# Patient Record
Sex: Female | Born: 1975 | Race: Asian | Hispanic: No | Marital: Married | Smoking: Never smoker
Health system: Southern US, Community
[De-identification: ages and names within clinical notes are randomized; demographics above are authoritative.]

---

## 2007-12-10 ENCOUNTER — Emergency Department (HOSPITAL_COMMUNITY): Admission: EM | Admit: 2007-12-10 | Discharge: 2007-12-10 | Payer: Self-pay | Admitting: Family Medicine

## 2010-05-16 ENCOUNTER — Ambulatory Visit: Payer: Self-pay | Admitting: Nurse Practitioner

## 2010-05-16 ENCOUNTER — Encounter (INDEPENDENT_AMBULATORY_CARE_PROVIDER_SITE_OTHER): Payer: Self-pay | Admitting: Nurse Practitioner

## 2010-05-16 DIAGNOSIS — R109 Unspecified abdominal pain: Secondary | ICD-10-CM | POA: Insufficient documentation

## 2010-05-16 DIAGNOSIS — M549 Dorsalgia, unspecified: Secondary | ICD-10-CM | POA: Insufficient documentation

## 2010-05-16 DIAGNOSIS — M25519 Pain in unspecified shoulder: Secondary | ICD-10-CM | POA: Insufficient documentation

## 2010-05-16 DIAGNOSIS — G479 Sleep disorder, unspecified: Secondary | ICD-10-CM | POA: Insufficient documentation

## 2010-05-16 LAB — CONVERTED CEMR LAB
ALT: 9 units/L (ref 0–35)
Basophils Absolute: 0 10*3/uL (ref 0.0–0.1)
Blood in Urine, dipstick: NEGATIVE
CO2: 24 meq/L (ref 19–32)
Calcium: 8.7 mg/dL (ref 8.4–10.5)
Chloride: 104 meq/L (ref 96–112)
Cholesterol: 191 mg/dL (ref 0–200)
Creatinine, Ser: 0.51 mg/dL (ref 0.40–1.20)
Glucose, Urine, Semiquant: NEGATIVE
Hemoglobin: 10.9 g/dL — ABNORMAL LOW (ref 12.0–15.0)
Ketones, urine, test strip: NEGATIVE
Lymphocytes Relative: 42 % (ref 12–46)
Monocytes Absolute: 0.2 10*3/uL (ref 0.1–1.0)
Monocytes Relative: 5 % (ref 3–12)
Neutro Abs: 1.9 10*3/uL (ref 1.7–7.7)
RBC: 4.59 M/uL (ref 3.87–5.11)
RDW: 14.8 % (ref 11.5–15.5)
Rapid HIV Screen: NEGATIVE
Total CHOL/HDL Ratio: 4.3
WBC: 3.5 10*3/uL — ABNORMAL LOW (ref 4.0–10.5)

## 2010-05-18 ENCOUNTER — Encounter (INDEPENDENT_AMBULATORY_CARE_PROVIDER_SITE_OTHER): Payer: Self-pay | Admitting: Nurse Practitioner

## 2010-05-18 DIAGNOSIS — D649 Anemia, unspecified: Secondary | ICD-10-CM | POA: Insufficient documentation

## 2010-05-18 DIAGNOSIS — E039 Hypothyroidism, unspecified: Secondary | ICD-10-CM | POA: Insufficient documentation

## 2010-06-21 NOTE — Assessment & Plan Note (Signed)
Summary: NEW - Establish Care   Vital Signs:  Patient profile:   35 year old female Menstrual status:  regular LMP:     04/16/2010 Height:      58.5 inches Weight:      108.50 pounds BMI:     22.37 Temp:     97.2 degrees F oral Pulse rate:   78 / minute Pulse rhythm:   regular Resp:     20 per minute BP sitting:   116 / 76  (left arm) Cuff size:   regular  Vitals Entered By: Hale Drone CMA (May 16, 2010 8:35 AM) CC: Here to establish care. Stomache pains after eating. Joints are aching and not able to sleep at night. Pt. is taking BC pills that come from [place]. , Abdominal Pain Is Patient Diabetic? No Pain Assessment Patient in pain? no       Does patient need assistance? Functional Status Self care Ambulation Normal LMP (date): 04/16/2010 LMP - Character: normal     Menstrual flow (days): 3 Menstrual Status regular Enter LMP: 04/16/2010   CC:  Here to establish care. Stomache pains after eating. Joints are aching and not able to sleep at night. Pt. is taking BC pills that come from [place].  and Abdominal Pain.  History of Present Illness:  Pt into the office to establish care. No previous PCP In [place] for 4 years - intake done then at [facility] but nothing since.  PMH - none PSH - C-Section  Meds - Currently takes oral contraception that she gets from [place].  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  The symptoms began 1 month ago.  On a scale of 1 to 10, the intensity is described as a 5.  The patient denies nausea, vomiting, diarrhea, and constipation.  The location of the pain is left lower quadrant.  The pain is described as intermittent.  The patient denies the following symptoms: fever, weight loss, and dysuria.  The pain is worse with food.     +lots of spicy -tobacco, no ETOH -tomato based foods +onions, garlic -coffee or soda or tea  Joint pain - shoulder and back  uncomfortable at night when trying to sleep. Pt works 5 days per week.  On days she does not work then the pain gets some better.  Admits that she does lots of bending and reaching while at work.    Habits & Providers  Alcohol-Tobacco-Diet     Alcohol drinks/day: 0     Tobacco Status: never  Exercise-Depression-Behavior     Have you felt down or hopeless? no     Have you felt little pleasure in things? no     Depression Counseling: not indicated; screening negative for depression     Drug Use: never  Current Medications (verified): 1)  None  Allergies (verified): No Known Drug Allergies  Past History:  Past Surgical History: Caesarean section x 1  Social History: [place] 2 children married Denies any ETOH, Drug, Tobacco abuseSmoking Status:  never Drug Use:  never  Review of Systems General:  Complains of sleep disorder; lays in bed but is not able to fall asleep. GI:  Complains of abdominal pain; denies nausea and vomiting. MS:  Complains  of joint pain.  Physical Exam  General:  alert.   Head:  normocephalic.   Ears:  earrings Lungs:  normal breath sounds.   Heart:  normal rate and regular rhythm.   Abdomen:  normal bowel sounds.   Msk:  normal ROM.   no limits Neurologic:  alert & oriented X3.   Skin:  color normal.   Psych:  Oriented X3.     Impression & Recommendations:  Problem # 1:  ABDOMINAL PAIN (ICD-789.00)  handout given will start ranitidine Orders: UA Dipstick w/o Micro (manual) (27253) T-Comprehensive Metabolic Panel [phone]) T-Lipid Profile (59563-87564) T-CBC w/Diff (33295-18841) T-TSH (66063-01601) Rapid HIV  (09323)  Problem # 2:  NEED PROPHYLACTIC VACCINATION&INOCULATION FLU (ICD-V04.81) given today in office  Problem # 3:  SLEEP DISORDER (ICD-780.50) will start tylenol pm  Problem # 4:   BACK PAIN (ICD-724.5) advised pt to use body mechanics will start tylenol PM  Complete Medication List: 1)  Ranitidine Hcl 150 Mg Tabs (Ranitidine hcl) .... One tablet by mouth 30 minutes  before breakfast and before dinner 2)  Tylenol Pm Extra Strength 500-25 Mg Tabs (Diphenhydramine-apap (sleep)) .... One tablet by mouth nightly for rest/joints  Other Orders: Flu Vaccine 69yrs + (55732) Admin 1st Vaccine (20254)  Patient Instructions: 1)  Stomach - Start Ranitidine 150mg  by mouth two times a day BEFORE breakfast and BEFORE dinner. 2)  Read handout 3)  Avoid spicy foods 4)  Joint pain - most likely from work.  May take tylenol at night for joints . 5)  Sleep - may be due to achy joints.  Will try to get joints better and see if sleep will improve.  Take the tylenol PM at night. 6)  You will get the flu vaccine today 7)  Schedule an appointment for a complete physical exam. 8)  Will need PAP.  9)  Transition to Extra Strength tylenol. Prescriptions: RANITIDINE HCL 150 MG TABS (RANITIDINE HCL) One tablet by mouth 30 minutes  before breakfast and before dinner  #60 x 3   Entered and Authorized by:   Lehman Prom FNP   Signed by:   Lehman Prom FNP on 05/16/2010   Method used:   Print then Give to Patient   RxID:   [id number]    Orders Added: 1)  Flu Vaccine 61yrs + [16073] 2)  Admin 1st Vaccine [90471] 3)  New Patient Level III [71062] 4)  UA Dipstick w/o Micro (manual) [81002] 5)  T-Comprehensive Metabolic Panel [80053-22900] 6)  T-Lipid Profile [80061-22930] 7)  T-CBC w/Diff [69485-46270] 8)  T-TSH [35009-38182] 9)  Rapid HIV  [92370]   Immunizations Administered:  Influenza Vaccine # 1:    Vaccine Type: Fluvax 3+    Site: left deltoid    Mfr: GlaxoSmithKline    Dose: 0.5 ml    Route: IM    Given by: Hale Drone CMA    Exp. Date: 11/17/2010    Lot #: [id number]    VIS given: 12/12/09 version given May 16, 2010.  Flu Vaccine Consent Questions:     Do you have a history of severe allergic reactions to this vaccine? no    Any prior history of allergic reactions to egg and/or gelatin? no    Do you have a sensitivity to the preservative Thimersol? no    Do you have a past history of Guillan-Barre Syndrome? no    Do you currently have an acute febrile illness? no    Have you ever had a severe reaction  to latex? no    Vaccine information given and explained to patient? yes    Are you currently pregnant? no   Immunizations Administered:  Influenza Vaccine # 1:    Vaccine Type: Fluvax 3+    Site: left deltoid    Mfr: GlaxoSmithKline    Dose: 0.5 ml    Route: IM    Given by: Hale Drone CMA    Exp. Date: 11/17/2010    Lot #: [id number]    VIS given: 12/12/09 version given May 16, 2010.   Laboratory Results   Urine Tests  Date/Time Received: May 16, 2010 9:08 AM   Routine Urinalysis   Color: yellow Glucose: negative   (Normal Range: Negative) Bilirubin: negative   (Normal Range: Negative) Ketone: negative   (Normal Range: Negative) Spec. Gravity: 1.020   (Normal Range: 1.003-1.035) Blood: negative   (Normal Range: Negative) pH: 7.5   (Normal Range: 5.0-8.0) Protein: negative   (Normal Range: Negative) Urobilinogen: 0.2   (Normal Range: 0-1) Nitrite: negative   (Normal Range: Negative) Leukocyte Esterace: trace   (Normal Range: Negative)      Other Tests  Rapid HIV: negative

## 2010-06-21 NOTE — Miscellaneous (Signed)
Summary: Lab results  Clinical Lists Changes Phone Note Outgoing Call   Summary of Call: notify pt that labs show that she is anemic. she should start taking ferrous sulfate 325mg  by mouth daily. she can buy this over the counter she should keep her appt in February as scheduled.  will repeat cbc at that time Initial call taken by: Lehman Prom FNP,  May 18, 2010 5:12 PM  Follow-up for Phone Call        pt informed of above information Follow-up by: Levon Hedger,  May 18, 2010 5:26 PM  New Problems: HYPOTHYROIDISM, BORDERLINE (ICD-244.9) ANEMIA (ICD-285.9)   New Problems: HYPOTHYROIDISM, BORDERLINE (ICD-244.9) ANEMIA (ICD-285.9) New/Updated Medications: FERROUS SULFATE 325 (65 FE) MG TBEC (FERROUS SULFATE) One tablet by mouth daily to build up blood   Problems: Added new problem of ANEMIA (ICD-285.9) Added new problem of HYPOTHYROIDISM, BORDERLINE (ICD-244.9) Medications: Added new medication of FERROUS SULFATE 325 (65 FE) MG TBEC (FERROUS SULFATE) One tablet by mouth daily to build up blood

## 2011-02-15 LAB — POCT URINALYSIS DIP (DEVICE)
Bilirubin Urine: NEGATIVE
Glucose, UA: NEGATIVE
Nitrite: NEGATIVE
Operator id: 24707

## 2011-02-15 LAB — WET PREP, GENITAL
Clue Cells Wet Prep HPF POC: NONE SEEN
Trich, Wet Prep: NONE SEEN

## 2011-02-15 LAB — POCT PREGNANCY, URINE
Operator id: 247071
Preg Test, Ur: NEGATIVE

## 2016-08-01 ENCOUNTER — Ambulatory Visit (INDEPENDENT_AMBULATORY_CARE_PROVIDER_SITE_OTHER): Payer: Worker's Compensation

## 2016-08-01 ENCOUNTER — Ambulatory Visit (INDEPENDENT_AMBULATORY_CARE_PROVIDER_SITE_OTHER): Payer: Worker's Compensation | Admitting: Physician Assistant

## 2016-08-01 VITALS — BP 124/80 | HR 78 | Temp 98.3°F | Resp 14 | Ht 59.0 in | Wt 126.0 lb

## 2016-08-01 DIAGNOSIS — M25522 Pain in left elbow: Secondary | ICD-10-CM

## 2016-08-01 DIAGNOSIS — S52122A Displaced fracture of head of left radius, initial encounter for closed fracture: Secondary | ICD-10-CM | POA: Diagnosis not present

## 2016-08-01 DIAGNOSIS — Z79899 Other long term (current) drug therapy: Secondary | ICD-10-CM

## 2016-08-01 DIAGNOSIS — M2560 Stiffness of unspecified joint, not elsewhere classified: Secondary | ICD-10-CM

## 2016-08-01 DIAGNOSIS — M25422 Effusion, left elbow: Secondary | ICD-10-CM

## 2016-08-01 LAB — POCT URINE PREGNANCY: PREG TEST UR: NEGATIVE

## 2016-08-01 MED ORDER — HYDROCODONE-ACETAMINOPHEN 7.5-325 MG PO TABS: 1.0000 | ORAL_TABLET | Freq: Four times a day (QID) | ORAL | 0 refills | Status: DC | PRN

## 2016-08-01 MED ORDER — ACETAMINOPHEN 500 MG PO TABS
1000.0000 mg | ORAL_TABLET | Freq: Once | ORAL | Status: AC
  Administered 2016-08-01: 1000 mg via ORAL

## 2016-08-01 NOTE — Addendum Note (Signed)
Addended by: Ofilia NeasLARK, Machael Raine L on: 08/01/2016 11:42 AM   Modules accepted: Orders

## 2016-08-01 NOTE — Patient Instructions (Addendum)
You will be seeing Debbie Rakersim [name] MD tomorrow morning @ 9am.  Leave the splint on until that time. Take pain medication as needed.   The address [name]  [address] [place] [id number]    IF you received an x-ray today, you will receive an invoice from [facility]. Please contact [facility] at [phone] with questions or concerns regarding your invoice.   IF you received labwork today, you will receive an invoice from [facility]. Please contact [facility] at [phone] with questions or concerns regarding your invoice.   Our billing staff will not be able to assist you with questions regarding bills from these companies.  You will be contacted with the lab results as soon as they are available. The fastest way to get your results is to activate your My Chart account. Instructions are located on the last page of this paperwork. If you have not heard from us regarding the results in 2 weeks, please contact this office.

## 2016-08-01 NOTE — Progress Notes (Signed)
08/01/2016 10:34 AM   DOB: [birth year] / MRN: [id number]  SUBJECTIVE:  Debbie Powell is a 41 y.o. female presenting for left elbow pain and swelling that started after a fall on an outstretched hand that occurred at work.  She associates the inability to both flex and supinate the elbow due to pain.  Associates wrist pain. Denies numbness and weakness.   She has No Known Allergies.   She  has no past medical history on file.    She  reports that she has never smoked. She has never used smokeless tobacco. She  has no sexual activity history on file. The patient  has no past surgical history on file.  Her family history is not on file.  Review of Systems  Constitutional: Negative for fever.  Musculoskeletal: Positive for falls, joint pain and myalgias. Negative for back pain and neck pain.  Skin: Negative for rash.  Neurological: Positive for focal weakness (2/2 pain). Negative for dizziness, sensory change and headaches.    The problem list and medications were reviewed and updated by myself where necessary and exist elsewhere in the encounter.   OBJECTIVE:  BP 124/80   Pulse 78   Temp 98.3 F (36.8 C) (Oral)   Resp 14   Ht 4\' 11"  (1.499 m)   Wt 126 lb (57.2 kg)   LMP 07/17/2016   SpO2 100%   BMI 25.45 kg/m   Physical Exam  Constitutional: She is oriented to person, place, and time.  Cardiovascular: Normal rate and regular rhythm.   Pulmonary/Chest: Effort normal.  Musculoskeletal:       Left forearm: She exhibits tenderness, bony tenderness and swelling. She exhibits no edema, no deformity and no laceration.       Arms: Neurological: She is alert and oriented to person, place, and time. She displays normal reflexes. No cranial nerve deficit. She exhibits normal muscle tone. Coordination normal.  Skin: Skin is warm and dry.    No results found for this or any previous visit (from the past 72 hour(s)).  Dg Elbow Complete Left (3+view)  Result Date: 08/01/2016 CLINICAL  DATA:  Larey Seat at work this morning, LEFT elbow injury EXAM: LEFT ELBOW - COMPLETE 3+ VIEW COMPARISON:  None FINDINGS: Osseous mineralization normal. Joint spaces preserved. Displaced intra-articular fracture LEFT radial head. Small elbow joint effusion. No additional fracture, dislocation, or bone destruction. IMPRESSION: Displaced intra-articular fracture LEFT radial head. Electronically Signed   By: Ulyses Southward M.D.   On: 08/01/2016 09:53   Dg Wrist Complete Left  Result Date: 08/01/2016 CLINICAL DATA:  Pain.  Fall . EXAM: LEFT WRIST - COMPLETE 3+ VIEW COMPARISON:  None. FINDINGS: There is no evidence of fracture or dislocation. There is no evidence of arthropathy or other focal bone abnormality. Soft tissues are unremarkable. IMPRESSION: Negative. Electronically Signed   By: Maisie Fus  Register   On: 08/01/2016 09:54    ASSESSMENT AND PLAN:  [name] was seen today for arm injury.  Diagnoses and all orders for this visit:  Left elbow pain: See problem 4. She will come back in next Tuesday for recheck.   -     acetaminophen (TYLENOL) tablet 1,000 mg; Take 2 tablets (1,000 mg total) by mouth once.  Elbow swelling, left -     DG ELBOW COMPLETE LEFT (3+VIEW); Future  Range of motion deficit -     DG Wrist Complete Left; Future  Closed displaced fracture of head of left radius, initial encounter: I spoke to Dr. Madelon Lips in orthopedics  who wants to the patient seen tomorrow morning.  Appreciate his time and recs.  He advised a posterior arm splint and narcotics.   -     Ambulatory referral to Orthopedic Surgery    The patient is advised to call or return to clinic if she does not see an improvement in symptoms, or to seek the care of the closest emergency department if she worsens with the above plan.   Deliah BostonMichael Clark, MHS, PA-C [facility] 08/01/2016 10:34 AM

## 2016-08-02 ENCOUNTER — Other Ambulatory Visit (HOSPITAL_COMMUNITY): Payer: Self-pay | Admitting: Orthopedic Surgery

## 2016-08-02 DIAGNOSIS — M25522 Pain in left elbow: Secondary | ICD-10-CM

## 2016-08-06 ENCOUNTER — Ambulatory Visit (HOSPITAL_COMMUNITY)
Admission: RE | Admit: 2016-08-06 | Discharge: 2016-08-06 | Disposition: A | Discharge status: Home / Self Care | Payer: Worker's Compensation | Source: Ambulatory Visit | Attending: Orthopedic Surgery | Admitting: Orthopedic Surgery

## 2016-08-06 ENCOUNTER — Ambulatory Visit (INDEPENDENT_AMBULATORY_CARE_PROVIDER_SITE_OTHER): Payer: Worker's Compensation | Admitting: Physician Assistant

## 2016-08-06 VITALS — BP 106/70 | HR 88 | Temp 98.6°F | Resp 16 | Ht 59.0 in | Wt 125.0 lb

## 2016-08-06 DIAGNOSIS — X58XXXA Exposure to other specified factors, initial encounter: Secondary | ICD-10-CM | POA: Diagnosis not present

## 2016-08-06 DIAGNOSIS — S52122A Displaced fracture of head of left radius, initial encounter for closed fracture: Secondary | ICD-10-CM

## 2016-08-06 DIAGNOSIS — M25522 Pain in left elbow: Secondary | ICD-10-CM | POA: Insufficient documentation

## 2016-08-06 DIAGNOSIS — M25422 Effusion, left elbow: Secondary | ICD-10-CM | POA: Diagnosis not present

## 2016-08-06 DIAGNOSIS — S52123A Displaced fracture of head of unspecified radius, initial encounter for closed fracture: Secondary | ICD-10-CM | POA: Diagnosis not present

## 2016-08-06 MED ORDER — MELOXICAM 15 MG PO TABS: 15.0000 mg | ORAL_TABLET | Freq: Every day | ORAL | 1 refills | Status: DC

## 2016-08-06 NOTE — Progress Notes (Signed)
  08/06/2016 2:13 PM   DOB: 07/24/1975 / MRN: [id number]  SUBJECTIVE:  Debbie Powell is a 41 y.o. female presenting for a left radial head fracture that occurred at work on 08/01/16. She has a Nurse, learning disabilitytranslator with her today and tells me that during the accident she tripped over an object in the hallway and her arm got caught up in a metal fence upon falling.  She was taking some papers to the office during the incident.  Today she continues to complain of significant pain as a result of the elbow fracture.  She can only take 1/2 the norco as this does make her dizzy and sleepy, but it does a good job of controlling her pain.  She would like to try another class of medication today along with her narcotic.   She is following all medical advise at this time.   She has No Known Allergies.   She  has no past medical history on file.    She  reports that she has never smoked. She has never used smokeless tobacco. She  has no sexual activity history on file. The patient  has no past surgical history on file.  Her family history is not on file.  Review of Systems  Gastrointestinal: Negative for abdominal pain and nausea.  Musculoskeletal: Positive for falls and joint pain. Negative for back pain, myalgias and neck pain.  Skin: Negative for rash.  Neurological: Positive for dizziness.    The problem list and medications were reviewed and updated by myself where necessary and exist elsewhere in the encounter.   OBJECTIVE:  BP 106/70   Pulse 88   Temp 98.6 F (37 C) (Oral)   Resp 16   Ht 4\' 11"  (1.499 m)   Wt 125 lb (56.7 kg)   LMP 07/17/2016   SpO2 99%   BMI 25.25 kg/m   Physical Exam  Constitutional: She is oriented to person, place, and time.  Cardiovascular: Normal rate and regular rhythm.   Pulmonary/Chest: Effort normal and breath sounds normal.  Musculoskeletal:       Arms: Neurological: She is alert and oriented to person, place, and time.    No results found for this or any  previous visit (from the past 72 hour(s)).  No results found.  ASSESSMENT AND PLAN:  Ronald Pippinsuza was seen today for follow-up.  Diagnoses and all orders for this visit:  Left elbow pain: She is out of work until cleared by orthopedic surgery as she is having too much pain and needs to be able to take pain medication which can be sedating. She will leave the splint on and follow all orthopedic reqs at this time and she has a CT scan to go to today at 4 pm.  -     meloxicam (MOBIC) 15 MG tablet; Take 1 tablet (15 mg total) by mouth daily. Take in the morning with food.  Closed displaced fracture of head of left radius, initial encounter  Elbow swelling, left    The patient is advised to call or return to clinic if she does not see an improvement in symptoms, or to seek the care of the closest emergency department if she worsens with the above plan.   Deliah BostonMichael Jovante Hammitt, MHS, PA-C [facility] 08/06/2016 2:13 PM

## 2016-08-06 NOTE — Patient Instructions (Addendum)
  Take Colace as instructed on the package if you feel that you are becoming constipated.    IF you received an x-ray today, you will receive an invoice from [facility]. Please contact [facility] at [phone] with questions or concerns regarding your invoice.   IF you received labwork today, you will receive an invoice from [facility]. Please contact [facility] at [phone] with questions or concerns regarding your invoice.   Our billing staff will not be able to assist you with questions regarding bills from these companies.  You will be contacted with the lab results as soon as they are available. The fastest way to get your results is to activate your My Chart account. Instructions are located on the last page of this paperwork. If you have not heard from us regarding the results in 2 weeks, please contact this office.

## 2016-08-22 ENCOUNTER — Ambulatory Visit (INDEPENDENT_AMBULATORY_CARE_PROVIDER_SITE_OTHER): Payer: Worker's Compensation | Admitting: Physician Assistant

## 2016-08-22 ENCOUNTER — Emergency Department (HOSPITAL_COMMUNITY)
Admission: EM | Admit: 2016-08-22 | Discharge: 2016-08-22 | Disposition: A | Discharge status: Home / Self Care | Payer: Worker's Compensation | Attending: Physician Assistant | Admitting: Physician Assistant

## 2016-08-22 ENCOUNTER — Emergency Department (HOSPITAL_COMMUNITY)
Admission: EM | Admit: 2016-08-22 | Discharge: 2016-08-22 | Discharge status: Absent without leave | Payer: 59 | Attending: Physician Assistant | Admitting: Physician Assistant

## 2016-08-22 VITALS — BP 118/78 | HR 70 | Temp 98.7°F | Resp 14 | Ht 59.0 in | Wt 126.0 lb

## 2016-08-22 DIAGNOSIS — M79661 Pain in right lower leg: Secondary | ICD-10-CM

## 2016-08-22 LAB — POCT URINE PREGNANCY: PREG TEST UR: NEGATIVE

## 2016-08-22 NOTE — Progress Notes (Signed)
  08/22/2016 7:49 PM   DOB: [birth year] / MRN: [id number]  SUBJECTIVE:  Debbie Powell is a 41 y.o. female presenting for here for recheck of worker's comp injury. She has been largely followed by orthopedics for this and did have fixation surgery about 1 week ago.  She is doing well today and has been compliant with all medication recommendations thus far.    Today she complains of right posterior calf pain that is throbbing in nature.  The pain is worse with walking.  She denies knee pain and ankle pain.  She denies swelling.  She feels that she is getting worse.   She is a never smoker.   Review of Systems  Constitutional: Negative for chills and fever.  Respiratory: Negative for cough and shortness of breath.   Cardiovascular: Negative for chest pain.  Skin: Negative for itching and rash.  Neurological: Negative for dizziness.    The problem list and medications were reviewed and updated by myself where necessary and exist elsewhere in the encounter.   OBJECTIVE:  BP 118/78   Pulse 70   Temp 98.7 F (37.1 C) (Oral)   Resp 14   Ht  (1.499 m)   Wt 126 lb (57.2 kg)   SpO2 100%   BMI 25.45 kg/m   Physical Exam  Constitutional: She is oriented to person, place, and time.  Cardiovascular: Normal rate and regular rhythm.   Pulmonary/Chest: Effort normal and breath sounds normal.  Abdominal: Soft. Bowel sounds are normal.  Musculoskeletal: She exhibits tenderness (right gastrocnemius).  Neurological: She is alert and oriented to person, place, and time. No cranial nerve deficit.  Skin: Skin is warm and dry.  Psychiatric: She has a normal mood and affect.    Results for orders placed or performed in visit on 08/22/16 (from the past 72 hour(s))  POCT urine pregnancy     Status: None   Collection Time: 08/22/16  5:48 PM  Result Value Ref Range   Preg Test, Ur Negative Negative    No results found.  ASSESSMENT AND PLAN:  Debbie Powell was seen today for follow-up.  Diagnoses and  all orders for this visit:  Pain of right calf: Given recent surgery I am obviously concerned about a DVT.  Will scan the leg tonight and patient is currently at vascular imaging where preliminary results are negative for DVT.  Patient called and advised the results are negative and she should continue the meloxicam.  -     POCT urine pregnancy -     VAS [place] LOWER EXTREMITY VENOUS (DVT); Future    The patient is advised to call or return to clinic if she does not see an improvement in symptoms, or to seek the care of the closest emergency department if she worsens with the above plan.   Deliah Boston, MHS, PA-C [facility] 08/22/2016 7:49 PM

## 2016-08-22 NOTE — Patient Instructions (Addendum)
   GO TO [facility] NOW AND TELL THEM YOU ARE THERE FOR A VASCULAR U/S OUTPATIENT [address]  IF you received an x-ray today, you will receive an invoice from [facility]. Please contact [facility] at [phone] with questions or concerns regarding your invoice.   IF you received labwork today, you will receive an invoice from [place]. Please contact [facility] at [phone] with questions or concerns regarding your invoice.   Our billing staff will not be able to assist you with questions regarding bills from these companies.  You will be contacted with the lab results as soon as they are available. The fastest way to get your results is to activate your My Chart account. Instructions are located on the last page of this paperwork. If you have not heard from [place] regarding the results in 2 weeks, please contact this office.

## 2016-08-22 NOTE — Progress Notes (Signed)
**  Preliminary report by tech**  Right lower extremity venous duplex complete. There is no evidence of deep or superficial vein thrombosis involving the right lower extremity. All visualized vessels appear patent and compressible. There is no evidence of a Baker's cyst on the right.  Results were faxed to Deliah Boston.  08/22/16 7:14 PM Olen Cordial RVT

## 2016-08-23 NOTE — Progress Notes (Signed)
Patient made aware via phone call by me of negative result.  Will see her back after her next visit with orthopedics.  Advised that she try meloxicam as her symptoms are most likely 2/2 soft tissue injury/muscle strain. Deliah Boston, MS, PA-C 12:23 PM, 08/23/2016

## 2018-03-08 IMAGING — DX DG WRIST COMPLETE 3+V*L*
4 series · 4 of 4 positions shown · non-contrast
Comparison: None.

CLINICAL DATA: Pain.  Fall .

EXAM:
LEFT WRIST - COMPLETE 3+ VIEW

[wrist pa]
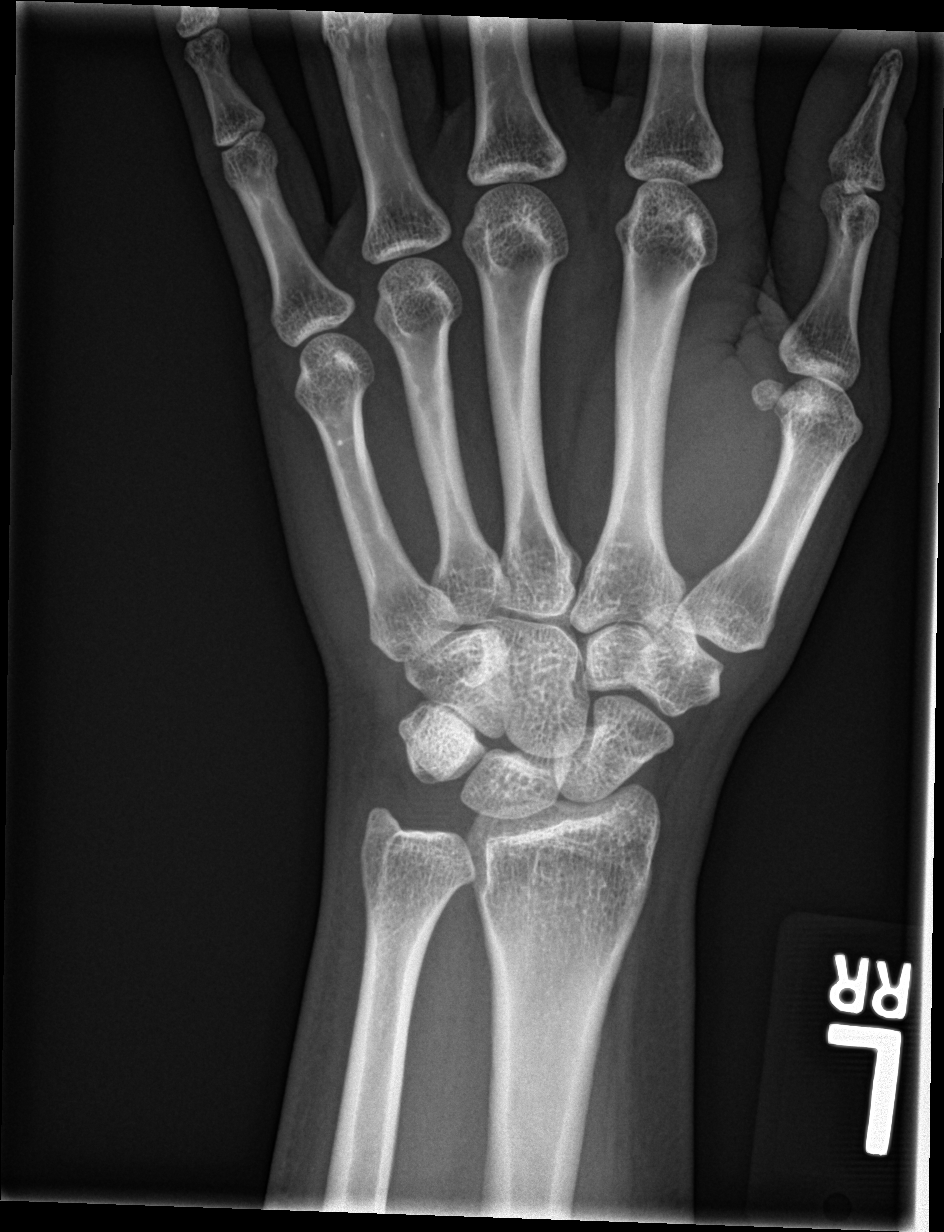

[wrist obl]
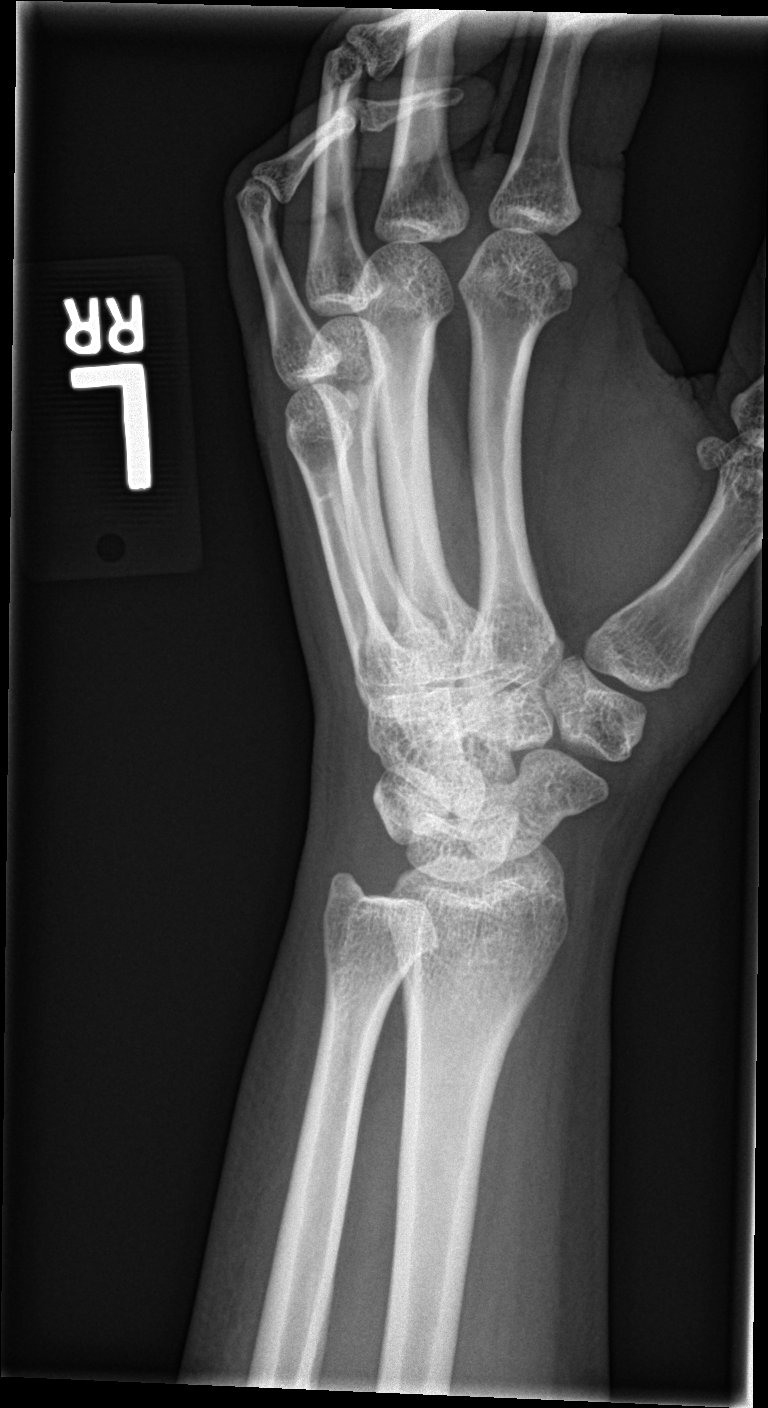

[wrist lat]
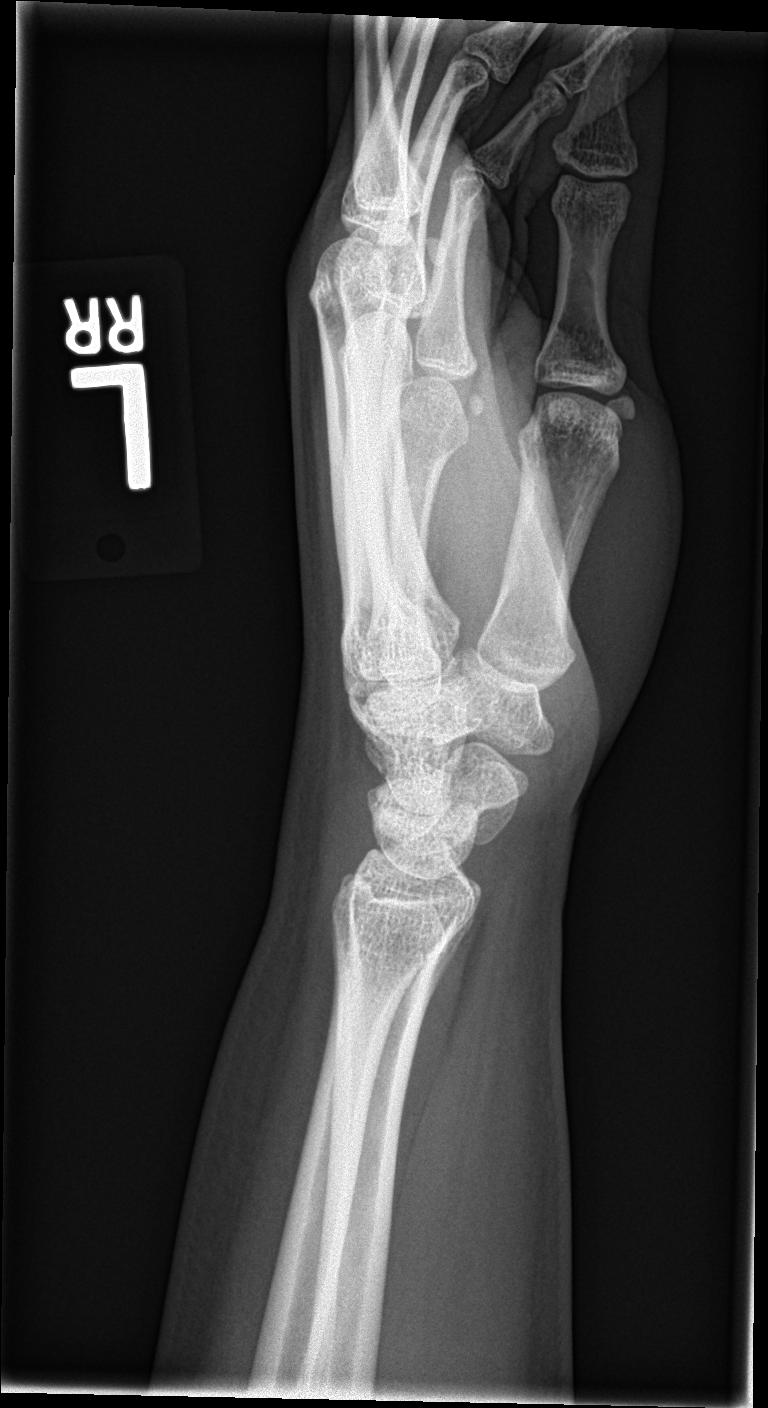

[wrist navicular]
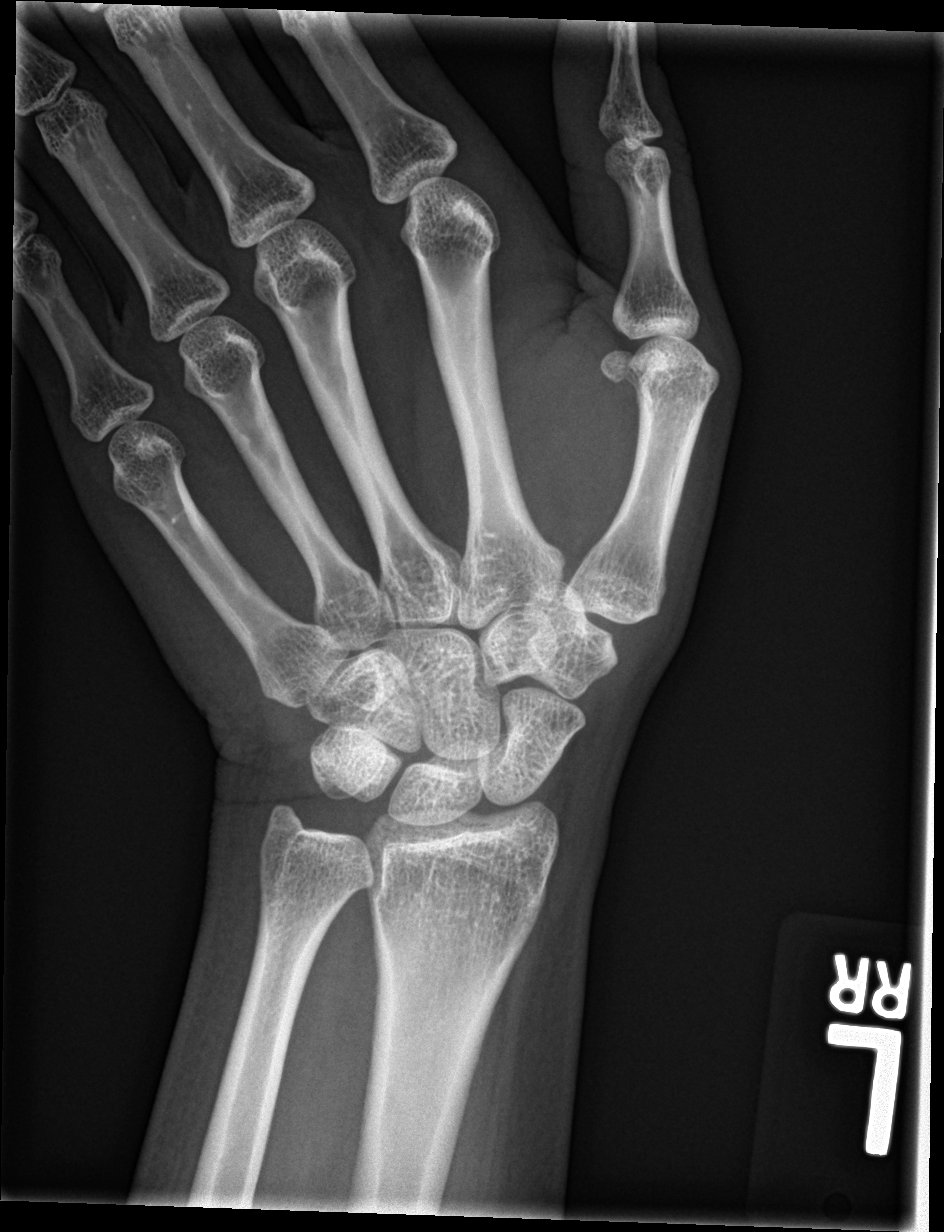

[4 of 4 positions shown; findings below may reference images not displayed]

FINDINGS: There is no evidence of fracture or dislocation. There is no
evidence of arthropathy or other focal bone abnormality. Soft
tissues are unremarkable.
IMPRESSION: Negative.

## 2018-05-24 ENCOUNTER — Ambulatory Visit (HOSPITAL_COMMUNITY)
Admission: EM | Admit: 2018-05-24 | Discharge: 2018-05-24 | Disposition: A | Discharge status: Home / Self Care | Payer: 59 | Attending: Family Medicine | Admitting: Family Medicine

## 2018-05-24 ENCOUNTER — Encounter (HOSPITAL_COMMUNITY): Payer: Self-pay | Admitting: Emergency Medicine

## 2018-05-24 DIAGNOSIS — N3000 Acute cystitis without hematuria: Secondary | ICD-10-CM | POA: Diagnosis not present

## 2018-05-24 DIAGNOSIS — R519 Headache, unspecified: Secondary | ICD-10-CM

## 2018-05-24 DIAGNOSIS — R51 Headache: Secondary | ICD-10-CM | POA: Insufficient documentation

## 2018-05-24 DIAGNOSIS — M545 Low back pain, unspecified: Secondary | ICD-10-CM

## 2018-05-24 LAB — POCT URINALYSIS DIP (DEVICE)
BILIRUBIN URINE: NEGATIVE
GLUCOSE, UA: NEGATIVE mg/dL
Ketones, ur: NEGATIVE mg/dL
NITRITE: NEGATIVE
PH: 7 (ref 5.0–8.0)
Protein, ur: NEGATIVE mg/dL
Specific Gravity, Urine: 1.015 (ref 1.005–1.030)
Urobilinogen, UA: 0.2 mg/dL (ref 0.0–1.0)

## 2018-05-24 LAB — POCT PREGNANCY, URINE: PREG TEST UR: NEGATIVE

## 2018-05-24 MED ORDER — NITROFURANTOIN MONOHYD MACRO 100 MG PO CAPS: 100.0000 mg | ORAL_CAPSULE | Freq: Two times a day (BID) | ORAL | 0 refills | Status: AC

## 2018-05-24 NOTE — ED Provider Notes (Signed)
MRN: [id number] DOB: [birth year]  Subjective:   Debbie Powell is a 43 y.o. female presenting for 2 week history of mild intermittent dysuria, intermittent dull aching headaches, low back pain that is mild-moderate and occurs intermittently.  Also reports having bothersome scratchy throat, drainage.  Has tried APAP with minimal relief. Denies hematuria, urinary urgency, flank pain, abdominal pain, pelvic pain, genital rash and vaginal discharge, nausea and vomiting.  Denies cough, chest pain, shortness of breath, wheezing.  Denies smoking cigarettes.  Takes OCP. No Known Allergies. Denies chronic medical conditions.    Past Surgical History:  Procedure Laterality Date  . CESAREAN SECTION      Objective:   Vitals: BP 129/89   Pulse 83   Temp 98.8 F (37.1 C)   Resp 18   LMP 05/16/2018   SpO2 100%   Physical Exam Constitutional:      General: She is not in acute distress.    Appearance: Normal appearance. She is well-developed and normal weight. She is not ill-appearing, toxic-appearing or diaphoretic.  HENT:     Nose: Nose normal.     Mouth/Throat:     Mouth: Mucous membranes are moist.     Pharynx: No oropharyngeal exudate or posterior oropharyngeal erythema.  Eyes:     General: No scleral icterus.    Extraocular Movements: Extraocular movements intact.     Conjunctiva/sclera: Conjunctivae normal.     Pupils: Pupils are equal, round, and reactive to light.  Cardiovascular:     Rate and Rhythm: Normal rate and regular rhythm.     Heart sounds: No murmur. No friction rub. No gallop.   Pulmonary:     Effort: No respiratory distress.     Breath sounds: No stridor. No wheezing, rhonchi or rales.  Abdominal:     General: Bowel sounds are normal. There is no distension.     Palpations: Abdomen is soft. There is no mass.     Tenderness: There is no abdominal tenderness. There is no right CVA tenderness, left CVA tenderness or guarding.  Skin:    General: Skin is warm and dry.   Coloration: Skin is not pale.     Findings: No erythema or rash.  Neurological:     Mental Status: She is alert and oriented to person, place, and time.  Psychiatric:        Mood and Affect: Mood normal.        Behavior: Behavior normal.    Results for orders placed or performed during the hospital encounter of 05/24/18 (from the past 24 hour(s))  POCT urinalysis dip (device)     Status: Abnormal   Collection Time: 05/24/18 10:24 AM  Result Value Ref Range   Glucose, UA NEGATIVE NEGATIVE mg/dL   Bilirubin Urine NEGATIVE NEGATIVE   Ketones, ur NEGATIVE NEGATIVE mg/dL   Specific Gravity, Urine 1.015 1.005 - 1.030   Hgb urine dipstick TRACE (A) NEGATIVE   pH 7.0 5.0 - 8.0   Protein, ur NEGATIVE NEGATIVE mg/dL   Urobilinogen, UA 0.2 0.0 - 1.0 mg/dL   Nitrite NEGATIVE NEGATIVE   Leukocytes, UA TRACE (A) NEGATIVE    Assessment and Plan :   Acute cystitis without hematuria  Generalized headaches  Acute midline low back pain without sciatica  Counseled patient extensively on making sure she is hydrating adequately each day.  Her daughters report that she does not drink water at all.  I suspect that this is a source of her symptoms including headaches, urinary irritation, mild back pain.  She does not have CVA tenderness so I am not going to treat her for pyelonephritis.  Will use Macrobid to address cystitis, urine culture pending.  Take oral antihistamine to address allergies as a source of her postnasal drainage.  Counseled patient on potential for adverse effects with medications prescribed today, patient verbalized understanding. Return-to-clinic precautions discussed, patient verbalized understanding.    Wallis Bamberg, [place] 05/24/18 1042

## 2018-05-24 NOTE — Discharge Instructions (Signed)
Make sure you are drinking at least 2 liters of water daily.

## 2018-05-24 NOTE — ED Triage Notes (Signed)
Pt c/o dysuria x2 weeks, mild LLQ abdominal pain, headache, also c/o "a lot of mucous in my throat".

## 2020-01-26 ENCOUNTER — Other Ambulatory Visit: Payer: Self-pay | Admitting: Family Medicine

## 2020-01-26 DIAGNOSIS — Z1231 Encounter for screening mammogram for malignant neoplasm of breast: Secondary | ICD-10-CM
